# Patient Record
Sex: Male | Born: 1995 | Race: Black or African American | Hispanic: No | Marital: Single | Smoking: Never smoker
Health system: Southern US, Community
[De-identification: ages and names within clinical notes are randomized; demographics above are authoritative.]

## PROBLEM LIST (undated history)

## (undated) DIAGNOSIS — D571 Sickle-cell disease without crisis: Secondary | ICD-10-CM

## (undated) HISTORY — DX: Sickle-cell disease without crisis: D57.1

---
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
## 2016-03-23 ENCOUNTER — Encounter: Payer: Self-pay | Admitting: Podiatry

## 2016-03-23 ENCOUNTER — Ambulatory Visit (INDEPENDENT_AMBULATORY_CARE_PROVIDER_SITE_OTHER): Payer: BLUE CROSS/BLUE SHIELD | Admitting: Podiatry

## 2016-03-23 DIAGNOSIS — M2141 Flat foot [pes planus] (acquired), right foot: Secondary | ICD-10-CM

## 2016-03-23 DIAGNOSIS — M2142 Flat foot [pes planus] (acquired), left foot: Secondary | ICD-10-CM | POA: Diagnosis not present

## 2016-03-23 NOTE — Progress Notes (Signed)
Patient requesting new orthotics. Scanned today and will follow up in 4 weeks for dispensing.

## 2016-06-06 ENCOUNTER — Ambulatory Visit: Payer: Self-pay | Admitting: *Deleted

## 2016-06-06 DIAGNOSIS — R52 Pain, unspecified: Secondary | ICD-10-CM

## 2016-06-06 DIAGNOSIS — Q6650 Congenital pes planus, unspecified foot: Secondary | ICD-10-CM

## 2016-06-06 NOTE — Patient Instructions (Signed)

## 2016-06-06 NOTE — Progress Notes (Signed)
Patient ID: Joshua Bennett, male   DOB: Apr 16, 1995, 21 y.o.   MRN: [id number] Patient presents for orthotic pick up.  Verbal and written break in and wear instructions given.  Patient will follow up in 4 weeks if symptoms worsen or fail to improve.

## 2016-07-04 ENCOUNTER — Other Ambulatory Visit: Payer: BLUE CROSS/BLUE SHIELD

## 2016-08-22 ENCOUNTER — Other Ambulatory Visit: Payer: No Typology Code available for payment source | Admitting: Orthotics

## 2016-09-19 ENCOUNTER — Ambulatory Visit: Payer: BLUE CROSS/BLUE SHIELD | Admitting: Sports Medicine

## 2016-10-17 ENCOUNTER — Ambulatory Visit: Payer: No Typology Code available for payment source | Admitting: Sports Medicine

## 2018-08-13 ENCOUNTER — Ambulatory Visit: Payer: No Typology Code available for payment source | Admitting: Sports Medicine

## 2018-09-10 ENCOUNTER — Ambulatory Visit: Payer: No Typology Code available for payment source | Admitting: Sports Medicine

## 2018-10-01 ENCOUNTER — Ambulatory Visit: Payer: No Typology Code available for payment source | Admitting: Sports Medicine

## 2019-02-09 ENCOUNTER — Emergency Department (HOSPITAL_BASED_OUTPATIENT_CLINIC_OR_DEPARTMENT_OTHER): Payer: No Typology Code available for payment source

## 2019-02-09 ENCOUNTER — Encounter (HOSPITAL_BASED_OUTPATIENT_CLINIC_OR_DEPARTMENT_OTHER): Payer: Self-pay | Admitting: Emergency Medicine

## 2019-02-09 ENCOUNTER — Emergency Department (HOSPITAL_BASED_OUTPATIENT_CLINIC_OR_DEPARTMENT_OTHER)
Admission: EM | Admit: 2019-02-09 | Discharge: 2019-02-09 | Disposition: A | Discharge status: Home / Self Care | Payer: No Typology Code available for payment source | Attending: Emergency Medicine | Admitting: Emergency Medicine

## 2019-02-09 ENCOUNTER — Other Ambulatory Visit: Payer: Self-pay

## 2019-02-09 DIAGNOSIS — R630 Anorexia: Secondary | ICD-10-CM | POA: Insufficient documentation

## 2019-02-09 DIAGNOSIS — Z20828 Contact with and (suspected) exposure to other viral communicable diseases: Secondary | ICD-10-CM | POA: Insufficient documentation

## 2019-02-09 DIAGNOSIS — R197 Diarrhea, unspecified: Secondary | ICD-10-CM | POA: Diagnosis not present

## 2019-02-09 DIAGNOSIS — R1031 Right lower quadrant pain: Secondary | ICD-10-CM | POA: Diagnosis present

## 2019-02-09 DIAGNOSIS — R1030 Lower abdominal pain, unspecified: Secondary | ICD-10-CM | POA: Diagnosis not present

## 2019-02-09 DIAGNOSIS — R195 Other fecal abnormalities: Secondary | ICD-10-CM | POA: Insufficient documentation

## 2019-02-09 DIAGNOSIS — I880 Nonspecific mesenteric lymphadenitis: Secondary | ICD-10-CM | POA: Insufficient documentation

## 2019-02-09 DIAGNOSIS — R1032 Left lower quadrant pain: Secondary | ICD-10-CM | POA: Diagnosis not present

## 2019-02-09 DIAGNOSIS — F1210 Cannabis abuse, uncomplicated: Secondary | ICD-10-CM | POA: Diagnosis not present

## 2019-02-09 LAB — COMPREHENSIVE METABOLIC PANEL
ALT: 16 U/L (ref 0–44)
AST: 18 U/L (ref 15–41)
Albumin: 4.2 g/dL (ref 3.5–5.0)
Alkaline Phosphatase: 65 U/L (ref 38–126)
Anion gap: 11 (ref 5–15)
BUN: 9 mg/dL (ref 6–20)
CO2: 25 mmol/L (ref 22–32)
Calcium: 9.1 mg/dL (ref 8.9–10.3)
Chloride: 100 mmol/L (ref 98–111)
Creatinine, Ser: 1.15 mg/dL (ref 0.61–1.24)
GFR calc Af Amer: >60 mL/min (ref >60)
GFR calc non Af Amer: >60 mL/min (ref >60)
Glucose, Bld: 83 mg/dL (ref 70–99)
Potassium: 3.8 mmol/L (ref 3.5–5.1)
Sodium: 136 mmol/L (ref 135–145)
Total Bilirubin: 0.6 mg/dL (ref 0.3–1.2)
Total Protein: 7.7 g/dL (ref 6.5–8.1)

## 2019-02-09 LAB — CBC WITH DIFFERENTIAL/PLATELET
Abs Immature Granulocytes: 0.01 10*3/uL (ref 0.00–0.07)
Basophils Absolute: 0 10*3/uL (ref 0.0–0.1)
Basophils Relative: 1 %
Eosinophils Absolute: 0.2 10*3/uL (ref 0.0–0.5)
Eosinophils Relative: 5 %
HCT: 46.4 % (ref 39.0–52.0)
Hemoglobin: 15.3 g/dL (ref 13.0–17.0)
Immature Granulocytes: 0 %
Lymphocytes Relative: 36 %
Lymphs Abs: 1.5 10*3/uL (ref 0.7–4.0)
MCH: 26.7 pg (ref 26.0–34.0)
MCHC: 33 g/dL (ref 30.0–36.0)
MCV: 81.1 fL (ref 80.0–100.0)
Monocytes Absolute: 0.8 10*3/uL (ref 0.1–1.0)
Monocytes Relative: 20 %
Neutro Abs: 1.6 10*3/uL — ABNORMAL LOW (ref 1.7–7.7)
Neutrophils Relative %: 38 %
Platelets: 264 10*3/uL (ref 150–400)
RBC: 5.72 MIL/uL (ref 4.22–5.81)
RDW: 14 % (ref 11.5–15.5)
WBC: 4.1 10*3/uL (ref 4.0–10.5)
nRBC: 0 % (ref 0.0–0.2)

## 2019-02-09 LAB — URINALYSIS, ROUTINE W REFLEX MICROSCOPIC
Glucose, UA: NEGATIVE mg/dL
Hgb urine dipstick: NEGATIVE
Ketones, ur: >80 mg/dL — AB
Leukocytes,Ua: NEGATIVE
Nitrite: NEGATIVE
Protein, ur: NEGATIVE mg/dL
Specific Gravity, Urine: 1.025 (ref 1.005–1.030)
pH: 6 (ref 5.0–8.0)

## 2019-02-09 LAB — OCCULT BLOOD X 1 CARD TO LAB, STOOL: Fecal Occult Bld: NEGATIVE

## 2019-02-09 MED ORDER — DICYCLOMINE HCL 20 MG PO TABS: 20.0000 mg | ORAL_TABLET | Freq: Two times a day (BID) | ORAL | 0 refills | Status: DC

## 2019-02-09 MED ORDER — SODIUM CHLORIDE 0.9 % IV BOLUS
1000.0000 mL | Freq: Once | INTRAVENOUS | Status: AC
  Administered 2019-02-09: 1000 mL via INTRAVENOUS

## 2019-02-09 MED ORDER — IOHEXOL 300 MG/ML  SOLN
100.0000 mL | Freq: Once | INTRAMUSCULAR | Status: AC | PRN
  Administered 2019-02-09: 14:00:00 100 mL via INTRAVENOUS

## 2019-02-09 NOTE — Discharge Instructions (Signed)
There were findings consistent with mesenteric adenitis on the CT scan. Hand washing: Wash your hands throughout the day, but especially before and after touching the face, using the restroom, sneezing, coughing, or touching surfaces that have been coughed or sneezed upon. Hydration: Symptoms will be intensified and complicated by dehydration. Dehydration can also extend the duration of symptoms. Drink plenty of fluids and get plenty of rest. You should be drinking at least half a liter of water an hour to stay hydrated. Electrolyte drinks (ex. Gatorade, Powerade, Pedialyte) are also encouraged. You should be drinking enough fluids to make your urine light yellow, almost clear. If this is not the case, you are not drinking enough water. Please note that some of the treatments indicated below will not be effective if you are not adequately hydrated. Diet: Please concentrate on hydration, however, you may introduce food slowly.  Start with a clear liquid diet, progressed to a full liquid diet, and then bland solids as you are able. Pain or fever: Ibuprofen, Naproxen, or Tylenol for pain or fever.  Antiinflammatory medications: Take 600 mg of ibuprofen every 6 hours or 440 mg (over the counter dose) to 500 mg (prescription dose) of naproxen every 12 hours for the next 3 days. After this time, these medications may be used as needed for pain. Take these medications with food to avoid upset stomach. Choose only one of these medications, do not take them together. Acetaminophen (generic for Tylenol): Should you continue to have additional pain while taking the ibuprofen or naproxen, you may add in acetaminophen as needed. Your daily total maximum amount of acetaminophen from all sources should be limited to 4000mg /day for persons without liver problems, or 2000mg /day for those with liver problems. Diarrhea: May use medications such as loperamide (Imodium) or Bismuth subsalicylate (Pepto-Bismol). Bentyl: This  medication is what is known as an antispasmodic and is intended to help reduce abdominal discomfort. Follow-up: Follow-up with a primary care provider on this matter.  May also need to follow-up with gastroenterology should symptoms fail to resolve. Return: Return should you develop a fever, bloody diarrhea, increased abdominal pain, uncontrolled vomiting, or any other major concerns.  For prescription assistance, may try using prescription discount sites or apps, such as goodrx.com  You have a test pending for COVID-19.  Results typically return within about 48 hours.  Be sure to check MyChart for updated results.  We recommend isolating yourself until results are received.  Patients who have symptoms consistent with COVID-19 should self isolated for: At least 3 days (72 hours) have passed since recovery, defined as resolution of fever without the use of fever reducing medications and improvement in respiratory symptoms (e.g., cough, shortness of breath), and At least 7 days have passed since symptoms first appeared.  If you have no symptoms, but your test returns positive, recommend isolating for at least 10 days.

## 2019-02-09 NOTE — ED Triage Notes (Signed)
RLQ pain with diarrhea x 2 days. Concerned about blood in his stool as well.

## 2019-02-09 NOTE — ED Notes (Signed)
Pt d/c home with family. Work note given. Pt verbalized understanding that prescriptions have been sent to pharmacy listed on d/c instructions

## 2019-02-09 NOTE — ED Provider Notes (Signed)
[facility] EMERGENCY DEPARTMENT Provider Note   CSN: [id number] Arrival date & time: 02/09/19  1143     History   Chief Complaint Chief Complaint  Patient presents with   Abdominal Pain    HPI Joshua Bennett is a 23 y.o. male.     HPI   Joshua Bennett is a 23 y.o. male, with a history of sickle cell trait, presenting to the ED with abdominal pain beginning 2 days ago.  Pain is noted to have been right lower quadrant, left lower quadrant, across the lower abdomen, mild to moderate, aching and cramping.  Accompanied by decreased appetite and 2-3 loose stools a day.  Yesterday he began to note some drops of blood in the toilet and on the paper after bowel movement.  Denies fever/chills, nausea/vomiting, chest pain, shortness of breath, cough, urinary symptoms, flank/back pain, genital pain, or any other complaints.     Past Medical History:  Diagnosis Date   Sickle cell anemia (HCC)    trait only    There are no active problems to display for this patient.   History reviewed. No pertinent surgical history.      Home Medications    Prior to Admission medications   Medication Sig Start Date End Date Taking? Authorizing Provider  dicyclomine (BENTYL) 20 MG tablet Take 1 tablet (20 mg total) by mouth 2 (two) times daily. 02/09/19   Makenah Karas, Hillard DankerShawn C, PA-C    Family History Family History  Problem Relation Age of Onset   Sickle cell trait Mother     Social History Social History   Tobacco Use   Smoking status: Never Smoker   Smokeless tobacco: Never Used  Substance Use Topics   Alcohol use: Yes   Drug use: Yes    Types: Marijuana     Allergies   Tylenol [acetaminophen]   Review of Systems Review of Systems  Constitutional: Positive for appetite change. Negative for chills, diaphoresis and fever.  Respiratory: Negative for cough and shortness of breath.   Cardiovascular: Negative for chest pain.  Gastrointestinal: Positive for  abdominal pain, blood in stool and diarrhea. Negative for nausea and vomiting.  Genitourinary: Negative for dysuria, flank pain, frequency and hematuria.  Musculoskeletal: Negative for back pain.  Neurological: Negative for syncope and weakness.  All other systems reviewed and are negative.    Physical Exam Updated Vital Signs BP (!) 155/89 (BP Location: Left Arm)    Pulse 78    Temp 98.9 F (37.2 C) (Oral)    Resp 18    Ht 5\' 9"  (1.753 m)    Wt 81.6 kg    SpO2 100%    BMI 26.58 kg/m   Physical Exam Vitals signs and nursing note reviewed.  Constitutional:      General: He is not in acute distress.    Appearance: He is well-developed. He is not diaphoretic.  HENT:     Head: Normocephalic and atraumatic.     Mouth/Throat:     Mouth: Mucous membranes are moist.     Pharynx: Oropharynx is clear.  Eyes:     Conjunctiva/sclera: Conjunctivae normal.  Neck:     Musculoskeletal: Neck supple.  Cardiovascular:     Rate and Rhythm: Normal rate and regular rhythm.     Pulses: Normal pulses.          Radial pulses are 2+ on the right side and 2+ on the left side.     Heart sounds: Normal heart sounds.  Pulmonary:     Effort: Pulmonary effort is normal. No respiratory distress.     Breath sounds: Normal breath sounds.  Abdominal:     Palpations: Abdomen is soft.     Tenderness: There is abdominal tenderness (mild) in the right lower quadrant, periumbilical area and left lower quadrant. There is no guarding.  Musculoskeletal:     Right lower leg: No edema.     Left lower leg: No edema.  Lymphadenopathy:     Cervical: No cervical adenopathy.  Skin:    General: Skin is warm and dry.  Neurological:     Mental Status: He is alert.  Psychiatric:        Mood and Affect: Mood and affect normal.        Speech: Speech normal.        Behavior: Behavior normal.      ED Treatments / Results  Labs (all labs ordered are listed, but only abnormal results are displayed) Labs Reviewed    URINALYSIS, ROUTINE W REFLEX MICROSCOPIC - Abnormal; Notable for the following components:      Result Value   Bilirubin Urine SMALL (*)    Ketones, ur >80 (*)    All other components within normal limits  CBC WITH DIFFERENTIAL/PLATELET - Abnormal; Notable for the following components:   Neutro Abs 1.6 (*)    All other components within normal limits  NOVEL CORONAVIRUS, NAA (HOSP ORDER, SEND-OUT TO REF LAB; TAT 18-24 HRS)  COMPREHENSIVE METABOLIC PANEL  OCCULT BLOOD X 1 CARD TO LAB, STOOL    EKG None  Radiology Ct Abdomen Pelvis W Contrast  Result Date: 02/09/2019 CLINICAL DATA:  Abdominal pain, rectal bleeding EXAM: CT ABDOMEN AND PELVIS WITH CONTRAST TECHNIQUE: Multidetector CT imaging of the abdomen and pelvis was performed using the standard protocol following bolus administration of intravenous contrast. CONTRAST:  157mL OMNIPAQUE IOHEXOL 300 MG/ML  SOLN COMPARISON:  None. FINDINGS: Lower chest: No acute abnormality. Hepatobiliary: No focal liver abnormality is seen. No gallstones, gallbladder wall thickening, or biliary dilatation. Pancreas: Unremarkable. No pancreatic ductal dilatation or surrounding inflammatory changes. Spleen: Normal in size without focal abnormality. Adrenals/Urinary Tract: Adrenal glands are unremarkable. Kidneys are normal, without renal calculi, focal lesion, or hydronephrosis. Bladder is unremarkable. Stomach/Bowel: Stomach is within normal limits. Appendix appears normal (series 5, images 44-48). No evidence of bowel wall thickening, distention, or inflammatory changes. Vascular/Lymphatic: Multiple prominent mesenteric lymph nodes within the right lower quadrant largest measuring 9 mm short axis (series 2, image 50). No retroperitoneal lymphadenopathy. No acute vascular findings. Reproductive: Prostate is unremarkable. Other: No abdominal wall hernia or abnormality. No abdominopelvic ascites. Musculoskeletal: Marked sclerosis and erosive changes of the bilateral  SI joints. No acute fractures. IMPRESSION: 1. Prominent right lower quadrant mesenteric lymph nodes suggestive of mesenteric adenitis. 2. Sclerotic and erosive changes of the bilateral sacroiliac joints consistent with sacroiliitis. 3. No acute bowel inflammation or obstruction. Normal appendix. Electronically Signed   By: Davina Poke M.D.   On: 02/09/2019 14:42    Procedures Procedures (including critical care time)  Medications Ordered in ED Medications  sodium chloride 0.9 % bolus 1,000 mL (0 mLs Intravenous Stopped 02/09/19 1500)  iohexol (OMNIPAQUE) 300 MG/ML solution 100 mL (100 mLs Intravenous Contrast Given 02/09/19 1422)     Initial Impression / Assessment and Plan / ED Course  I have reviewed the triage vital signs and the nursing notes.  Pertinent labs & imaging results that were available during my care of the patient were reviewed  by me and considered in my medical decision making (see chart for details).        Patient presents with abdominal pain and diarrhea.  No incidence of diarrhea during ED course.  Patient is nontoxic appearing, afebrile, not tachycardic, not tachypneic, not hypotensive, maintains excellent SPO2 on room air, and is in no apparent distress.  No leukocytosis. Ketonuria likely associated with dehydration. CT with evidence of mesenteric adenitis with normal-appearing appendix. The patient was given instructions for home care as well as return precautions. Patient voices understanding of these instructions, accepts the plan, and is comfortable with discharge.  Findings and plan of care discussed with Carmell Austria, MD.   Final Clinical Impressions(s) / ED Diagnoses   Final diagnoses:  Mesenteric adenitis    ED Discharge Orders         Ordered    dicyclomine (BENTYL) 20 MG tablet  2 times daily     02/09/19 1510           Concepcion Living 02/09/19 1616    Benjiman Core, MD 02/10/19 [phone]

## 2019-02-11 LAB — NOVEL CORONAVIRUS, NAA (HOSP ORDER, SEND-OUT TO REF LAB; TAT 18-24 HRS): SARS-CoV-2, NAA: NOT DETECTED

## 2019-07-30 ENCOUNTER — Ambulatory Visit: Payer: No Typology Code available for payment source | Admitting: Podiatrist

## 2019-07-30 ENCOUNTER — Other Ambulatory Visit: Payer: Self-pay

## 2019-07-30 ENCOUNTER — Ambulatory Visit: Payer: No Typology Code available for payment source

## 2019-07-30 ENCOUNTER — Encounter: Payer: Self-pay | Admitting: Podiatrist

## 2019-07-30 VITALS — Temp 97.1°F

## 2019-07-30 DIAGNOSIS — M79672 Pain in left foot: Secondary | ICD-10-CM | POA: Diagnosis not present

## 2019-07-30 DIAGNOSIS — M79671 Pain in right foot: Secondary | ICD-10-CM

## 2019-07-30 DIAGNOSIS — Q6650 Congenital pes planus, unspecified foot: Secondary | ICD-10-CM

## 2019-07-30 DIAGNOSIS — M7752 Other enthesopathy of left foot: Secondary | ICD-10-CM

## 2019-07-30 DIAGNOSIS — M7751 Other enthesopathy of right foot: Secondary | ICD-10-CM | POA: Diagnosis not present

## 2019-07-30 DIAGNOSIS — M7750 Other enthesopathy of unspecified foot: Secondary | ICD-10-CM

## 2019-07-30 NOTE — Patient Instructions (Signed)
We will order you orthotics-  Will call you when they are ready for pick up in 3-4 weeks.

## 2019-07-30 NOTE — Progress Notes (Signed)
  Chief Complaint  Patient presents with  . Flat Foot    Bilateral. Pt stated, "It's the same issue that I've had since my last appt - foot (medial midfoot and medial side of ankles), leg, and hip pain". Pain can vary between 0-7/10. Rest helps. I wear down orthotics".      HPI: Patient is 24 y.o. male who presents today for the concerns as listed above.  Patient presents he has leg and hip pain and orthotics are beneficial.  He states he wears the orthotics down quickly and is in need of a new pair.   Review of Systems No fevers, chills, nausea, muscle aches, no difficulty breathing, no calf pain, no chest pain or shortness of breath.   Physical Exam  GENERAL APPEARANCE: Alert, conversant. Appropriately groomed. No acute distress.   VASCULAR: Pedal pulses palpable DP and PT bilateral.  Capillary refill time is immediate to all digits,  Proximal to distal cooling it warm to warm.  Digital hair growth is present bilateral   NEUROLOGIC: sensation is intact epicritically and protectively to 5.07 monofilament at 5/5 sites bilateral.  Light touch is intact bilateral, vibratory sensation intact bilateral, achilles tendon reflex is intact bilateral.   MUSCULOSKELETAL: acceptable muscle strength, tone and stability bilateral.  Pes planus foot deformity is noted bilateral with loss of the medial longitudinal arch heel valgus deformity and a medial talar prominence noted bilateral.  Diffuse pain along the posterior tibial tendon with standing is noted.  DERMATOLOGIC: skin is warm, supple, and dry.  No open lesions noted.  No rash, no pre ulcerative lesions. Digital nails are asymptomatic.      Assessment    ICD-10-CM   1. Bilateral foot pain  M79.671 CANCELED: DG Foot Complete Left   M79.672 CANCELED: DG Foot Complete Right  2. Congenital pes planus, unspecified laterality  Q66.50   3. Tendonitis of ankle or foot  M77.50      Plan  Patient was casted for new orthotics at today's visit.   We will make them similar to the last pair he had as these seem to have worked out well.  We will try an increased arch a little bit in hopes that this will help with the hip and back pain he is experiencing.  Be seen back for orthotic pickup.

## 2019-10-24 ENCOUNTER — Encounter: Payer: No Typology Code available for payment source | Admitting: Orthotics

## 2019-12-11 ENCOUNTER — Other Ambulatory Visit: Payer: Self-pay

## 2019-12-11 ENCOUNTER — Ambulatory Visit: Payer: BC Managed Care – PPO | Admitting: Orthotics

## 2019-12-11 DIAGNOSIS — M79672 Pain in left foot: Secondary | ICD-10-CM

## 2019-12-11 DIAGNOSIS — Q6650 Congenital pes planus, unspecified foot: Secondary | ICD-10-CM

## 2019-12-11 DIAGNOSIS — M79671 Pain in right foot: Secondary | ICD-10-CM

## 2019-12-11 NOTE — Progress Notes (Signed)
Patient came in today to pick up custom made foot orthotics.  The goals were accomplished and the patient reported no dissatisfaction with said orthotics.  Patient was advised of breakin period and how to report any issues. 

## 2020-10-05 IMAGING — CT CT ABD-PELV W/ CM
2 of 4 series · 16 of 46 positions shown, 18 images · IV contrast (Omnipaque)
Comparison: None.

CLINICAL DATA: Abdominal pain, rectal bleeding

EXAM:
CT ABDOMEN AND PELVIS WITH CONTRAST
TECHNIQUE: Multidetector CT imaging of the abdomen and pelvis was performed
using the standard protocol following bolus administration of
intravenous contrast.
CONTRAST:  100mL OMNIPAQUE IOHEXOL 300 MG/ML  SOLN

[Series 2: axial st · axial · 0.89mm/px · z∈[-368,+38]mm · 13 of 89 slices shown, 15 images]
[im 4/89  soft-tissue]
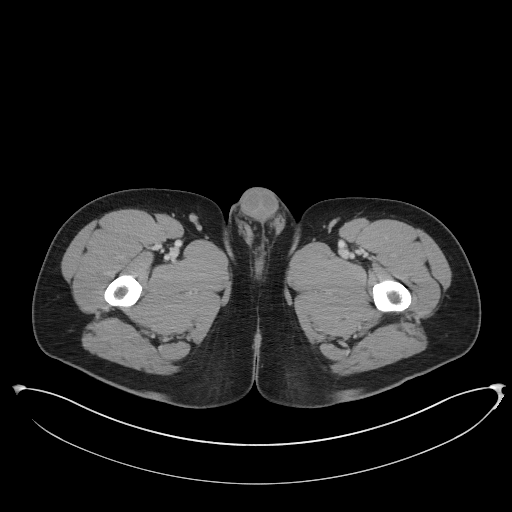
[im 4/89  bone]
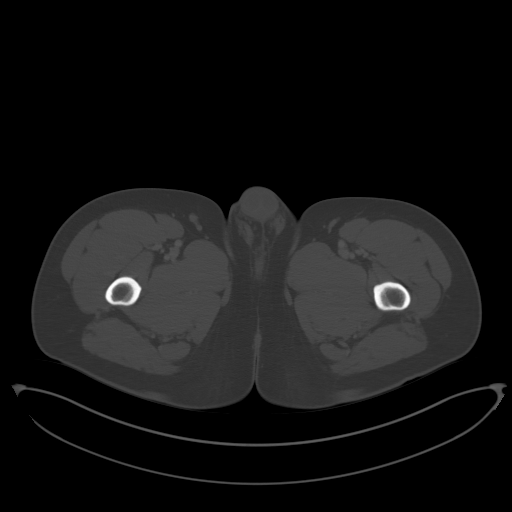
[im 11/89  soft-tissue]
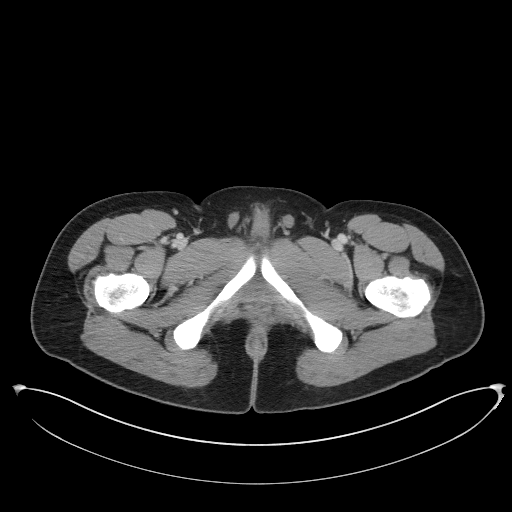
[im 17/89  soft-tissue]
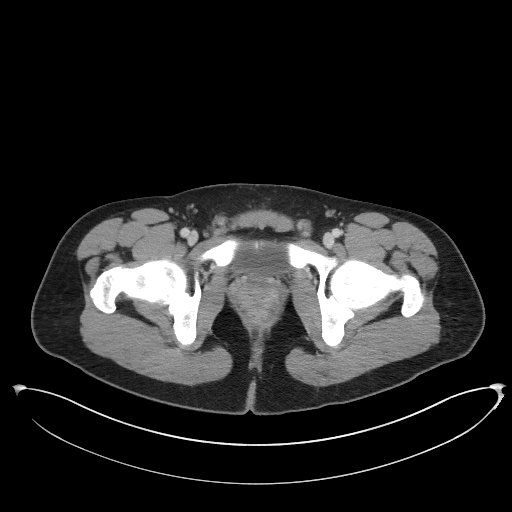
[im 24/89  soft-tissue]
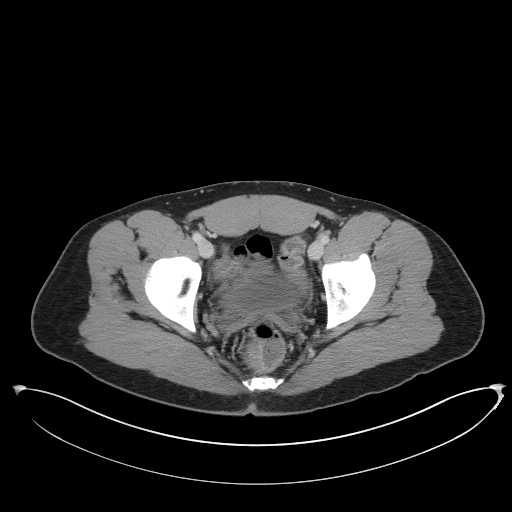
[im 31/89  soft-tissue]
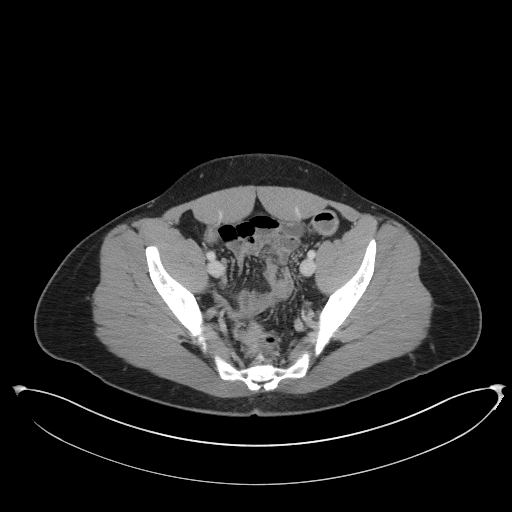
[im 38/89  soft-tissue]
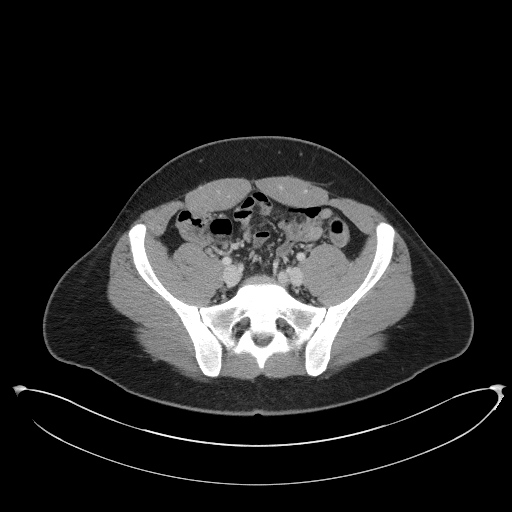
[im 45/89  soft-tissue]
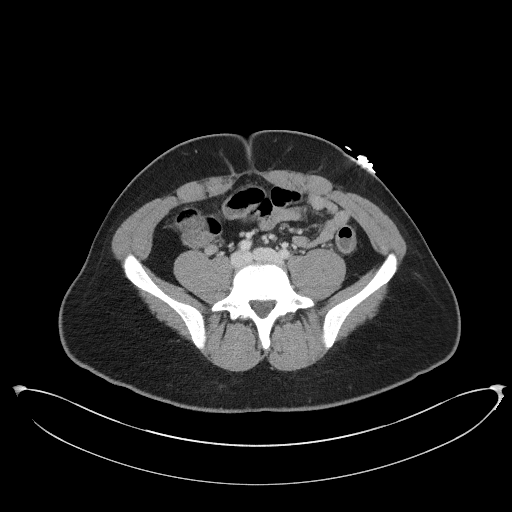
[im 51/89  soft-tissue]
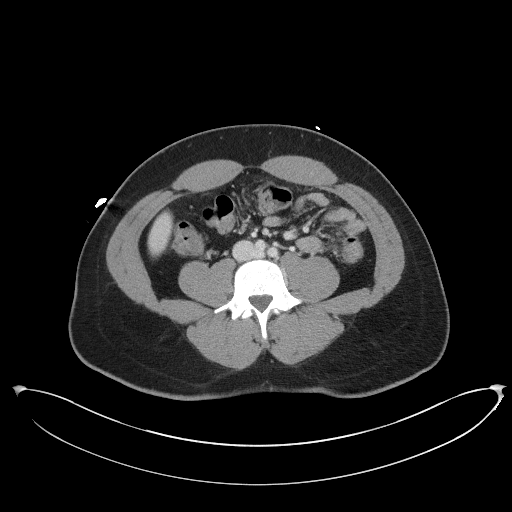
[im 58/89  soft-tissue]
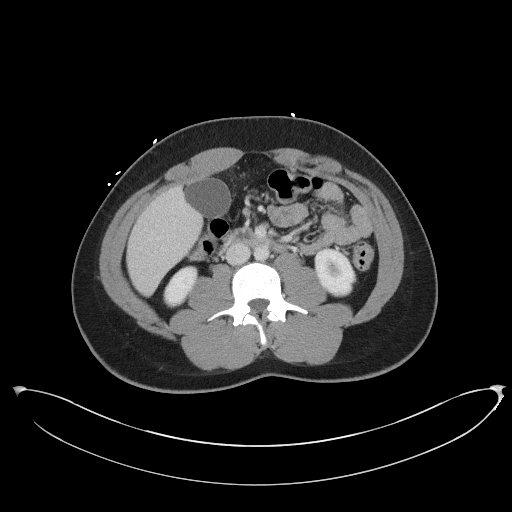
[im 58/89  bone]
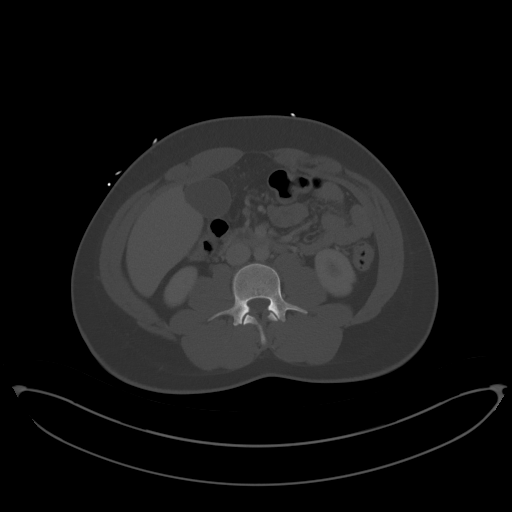
[im 65/89  soft-tissue]
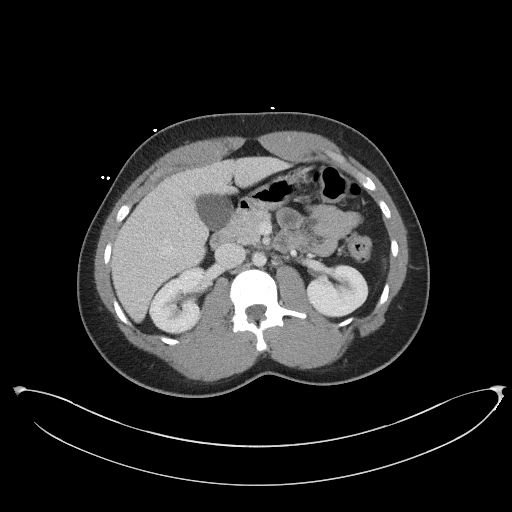
[im 72/89  soft-tissue]
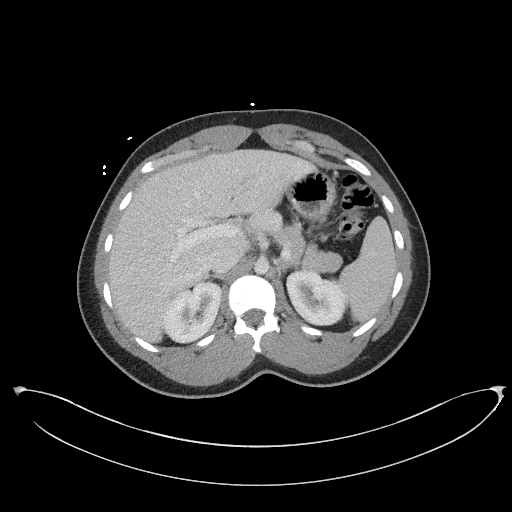
[im 78/89  soft-tissue]
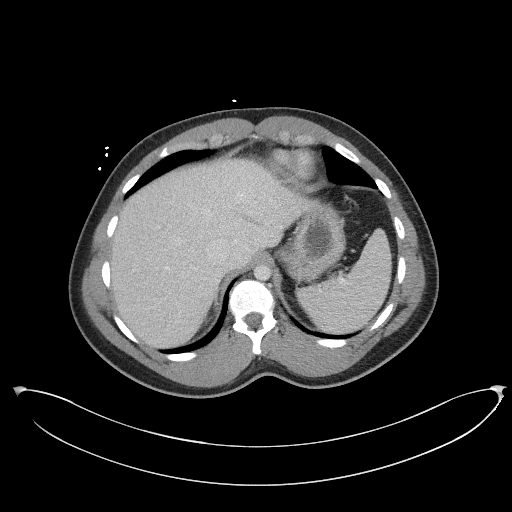
[im 85/89  soft-tissue]
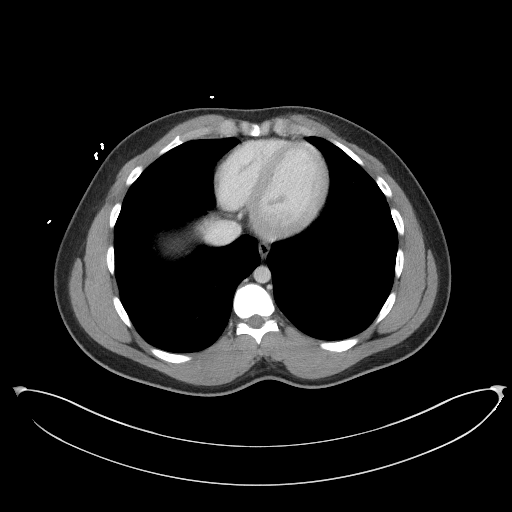

[Series 5: coronal st · coronal · 0.78mm/px · 3 of 92 slices shown]
[im 31/92  soft-tissue]
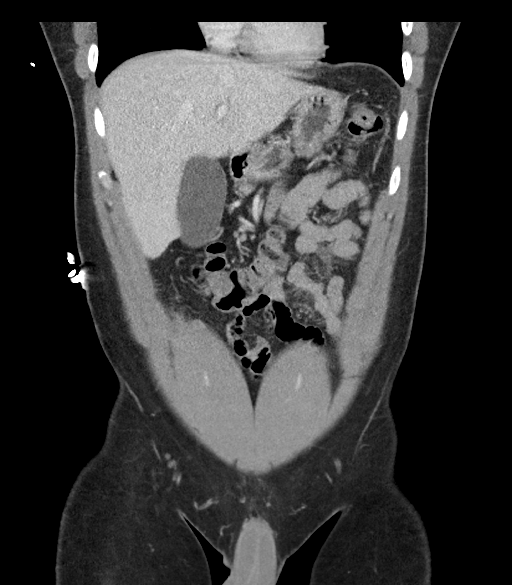
[im 41/92  soft-tissue]
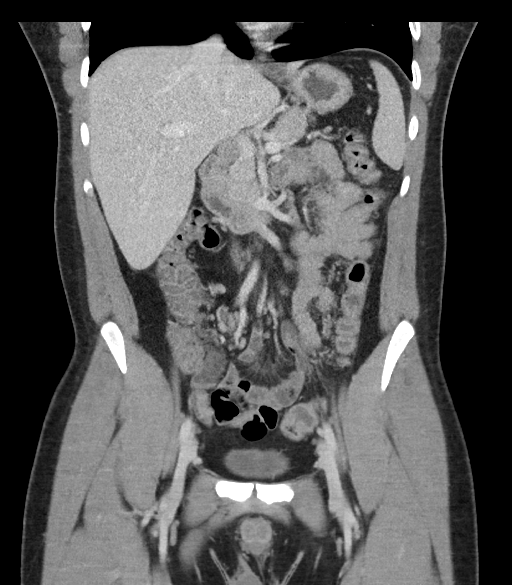
[im 51/92  soft-tissue]
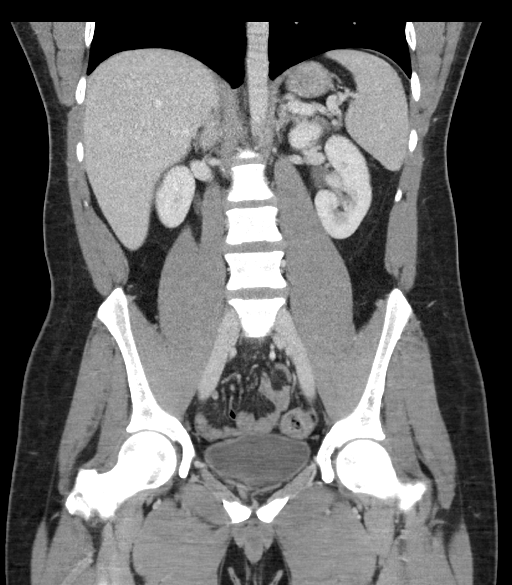

[16 of 46 positions shown; findings below may reference images not displayed]

FINDINGS: Lower chest: No acute abnormality.

Hepatobiliary: No focal liver abnormality is seen. No gallstones,
gallbladder wall thickening, or biliary dilatation.

Pancreas: Unremarkable. No pancreatic ductal dilatation or
surrounding inflammatory changes.

Spleen: Normal in size without focal abnormality.

Adrenals/Urinary Tract: Adrenal glands are unremarkable. Kidneys are
normal, without renal calculi, focal lesion, or hydronephrosis.
Bladder is unremarkable.

Stomach/Bowel: Stomach is within normal limits. Appendix appears
normal (series 5, images 44-48). No evidence of bowel wall
thickening, distention, or inflammatory changes.

Vascular/Lymphatic: Multiple prominent mesenteric lymph nodes within
the right lower quadrant largest measuring 9 mm short axis (series
2, image 50). No retroperitoneal lymphadenopathy. No acute vascular
findings.

Reproductive: Prostate is unremarkable.

Other: No abdominal wall hernia or abnormality. No abdominopelvic
ascites.

Musculoskeletal: Marked sclerosis and erosive changes of the
bilateral SI joints. No acute fractures.
IMPRESSION: 1. Prominent right lower quadrant mesenteric lymph nodes suggestive
of mesenteric adenitis.
2. Sclerotic and erosive changes of the bilateral sacroiliac joints
consistent with sacroiliitis.
3. No acute bowel inflammation or obstruction. Normal appendix.

## 2022-02-11 ENCOUNTER — Telehealth: Payer: Self-pay | Admitting: Nurse Practitioner

## 2022-02-11 MED ORDER — PREDNISONE 10 MG PO TABS: 40.0000 mg | ORAL_TABLET | Freq: Every day | ORAL | 0 refills | Status: AC

## 2022-02-11 NOTE — Telephone Encounter (Signed)
Patient called answering service with complaints of UC flare symptoms. Unable to see Dr. Kenna Gilbert records in Epic but patient gives a history of UC diagnosed about 5 months ago. He was initially treated with course of steroids with resolution of symptoms then  about a month ago started Mesalamine 4.8 grams daily. He developed crampy diarrhea with blood 3-4 days ago. No N/V or fevers.   Will call Rx prednisone 10 mg to take 40 mg daily. He was advised to call Dr. Kenna Gilbert office on Monday and she can provide him with a tapering schedule.
